# Patient Record
Sex: Female | Born: 1975 | Race: White | Hispanic: No | Marital: Married | Smoking: Never smoker
Health system: Southern US, Community
[De-identification: ages and names within clinical notes are randomized; demographics above are authoritative.]

## PROBLEM LIST (undated history)

## (undated) DIAGNOSIS — K219 Gastro-esophageal reflux disease without esophagitis: Secondary | ICD-10-CM

## (undated) DIAGNOSIS — F419 Anxiety disorder, unspecified: Secondary | ICD-10-CM

---

## 2007-01-18 ENCOUNTER — Inpatient Hospital Stay (HOSPITAL_COMMUNITY): Admission: AD | Admit: 2007-01-18 | Discharge: 2007-01-20 | Payer: Self-pay | Admitting: Obstetrics and Gynecology

## 2009-11-06 ENCOUNTER — Encounter: Admission: RE | Admit: 2009-11-06 | Discharge: 2009-11-06 | Payer: Self-pay | Admitting: Obstetrics and Gynecology

## 2010-05-21 ENCOUNTER — Inpatient Hospital Stay (HOSPITAL_COMMUNITY)
Admission: AD | Admit: 2010-05-21 | Discharge: 2010-05-21 | Disposition: A | Discharge status: Home / Self Care | Payer: BC Managed Care – PPO | Source: Ambulatory Visit | Attending: Obstetrics and Gynecology | Admitting: Obstetrics and Gynecology

## 2010-05-21 DIAGNOSIS — O2390 Unspecified genitourinary tract infection in pregnancy, unspecified trimester: Secondary | ICD-10-CM | POA: Insufficient documentation

## 2010-05-21 DIAGNOSIS — N390 Urinary tract infection, site not specified: Secondary | ICD-10-CM | POA: Insufficient documentation

## 2010-05-21 LAB — URINALYSIS, ROUTINE W REFLEX MICROSCOPIC
Bilirubin Urine: NEGATIVE
Hgb urine dipstick: NEGATIVE
Ketones, ur: NEGATIVE mg/dL

## 2010-05-21 LAB — URINE MICROSCOPIC-ADD ON

## 2010-05-22 ENCOUNTER — Inpatient Hospital Stay (HOSPITAL_COMMUNITY)
Admission: AD | Admit: 2010-05-22 | Discharge: 2010-05-22 | Disposition: A | Discharge status: Home / Self Care | Payer: BC Managed Care – PPO | Source: Ambulatory Visit | Attending: Obstetrics and Gynecology | Admitting: Obstetrics and Gynecology

## 2010-05-22 DIAGNOSIS — O4700 False labor before 37 completed weeks of gestation, unspecified trimester: Secondary | ICD-10-CM | POA: Insufficient documentation

## 2010-08-05 ENCOUNTER — Inpatient Hospital Stay (HOSPITAL_COMMUNITY)
Admission: AD | Admit: 2010-08-05 | Discharge: 2010-08-05 | Disposition: A | Discharge status: Home / Self Care | Payer: BC Managed Care – PPO | Source: Ambulatory Visit | Attending: Obstetrics and Gynecology | Admitting: Obstetrics and Gynecology

## 2010-08-05 DIAGNOSIS — O479 False labor, unspecified: Secondary | ICD-10-CM | POA: Insufficient documentation

## 2010-08-07 ENCOUNTER — Inpatient Hospital Stay (HOSPITAL_COMMUNITY)
Admission: AD | Admit: 2010-08-07 | Discharge: 2010-08-09 | DRG: 373 | Disposition: A | Discharge status: Home / Self Care | Payer: BC Managed Care – PPO | Source: Ambulatory Visit | Attending: Obstetrics and Gynecology | Admitting: Obstetrics and Gynecology

## 2010-08-07 LAB — CBC
Hemoglobin: 12.6 g/dL (ref 12.0–15.0)
MCV: 91 fL (ref 78.0–100.0)
Platelets: 191 10*3/uL (ref 150–400)
RDW: 13.7 % (ref 11.5–15.5)
WBC: 9.7 10*3/uL (ref 4.0–10.5)

## 2010-08-08 LAB — CBC
Hemoglobin: 10 g/dL — ABNORMAL LOW (ref 12.0–15.0)
MCH: 29.4 pg (ref 26.0–34.0)
MCHC: 32.4 g/dL (ref 30.0–36.0)
RBC: 3.4 MIL/uL — ABNORMAL LOW (ref 3.87–5.11)
WBC: 10.7 10*3/uL — ABNORMAL HIGH (ref 4.0–10.5)

## 2010-08-15 NOTE — Discharge Summary (Signed)
NAMEVIDALIA, SERPAS                  ACCOUNT NO.:  [id number]   MEDICAL RECORD NO.:  [id number]          PATIENT TYPE:  INP   LOCATION:  9108                          FACILITY:  [facility]   PHYSICIAN:  Gerrit Friends. Aldona Bar, M.D.   DATE OF BIRTH:  03-29-1976   DATE OF ADMISSION:  01/18/2007  DATE OF DISCHARGE:  01/20/2007                               DISCHARGE SUMMARY   DISCHARGE DIAGNOSES:  1. Term pregnancy, delivered of a 6 pound 15 ounces female infant,      Apgars 8 and 9.  2. Blood type A positive.   PROCEDURE:  1. Normal spontaneous delivery.  2. Second-degree tear and repair.   HISTORY OF PRESENT ILLNESS:  This 35 year old gravida 2, para 0 was  admitted for induction secondary to advanced cervical dilatation.  At  the time of admission, she was 4 cm dilated and she progressed on  Pitocin.  She subsequently underwent amniotomy with production of clear  fluid and subsequently had a normal spontaneous delivery of a 6 pound 15  ounces female infant with Apgars of 8 and 9 over a second-degree tear  which was repaired without difficulty.   Her postpartum course was benign.  Her discharge hemoglobin was 10.6  with a white count of 12,000 and a platelet count of 170,000.  On the  morning of January 20, 2007, she was ambulating well, tolerating a  regular diet well, having normal bowel and bladder function.  She was  breast feeding without difficulty and was desirous of discharge.  Accordingly, she was given all appropriate instructions per discharge  brochure and understood all instructions well.   DISCHARGE MEDICATIONS:  1. Vitamins 1 a day.  2. Feosol capsules 1 every other day.  3. The patient was given a prescription for Motrin 600 mg to use every      6 hours as needed for pain or cramps.   FOLLOW UP:  She will return to the office in followup in approximately 4  weeks' time or as needed.   CONDITION ON DISCHARGE:  Improved.      Gerrit Friends. Aldona Bar, M.D.  Electronically  Signed     RMW/MEDQ  D:  01/20/2007  T:  01/20/2007  Job:  [id number]

## 2011-01-07 LAB — CBC
HCT: 30.4 — ABNORMAL LOW
Hemoglobin: 10.6 — ABNORMAL LOW
MCHC: 34.7
Platelets: 170
RBC: 4.26
RDW: 13.8
WBC: 12 — ABNORMAL HIGH
WBC: 9.8

## 2014-03-30 HISTORY — PX: AUGMENTATION MAMMAPLASTY: SUR837

## 2018-04-11 DIAGNOSIS — N870 Mild cervical dysplasia: Secondary | ICD-10-CM | POA: Insufficient documentation

## 2019-06-09 ENCOUNTER — Other Ambulatory Visit: Payer: Self-pay | Admitting: Obstetrics & Gynecology

## 2019-06-09 DIAGNOSIS — N63 Unspecified lump in unspecified breast: Secondary | ICD-10-CM

## 2019-06-20 ENCOUNTER — Other Ambulatory Visit: Payer: Self-pay | Admitting: Obstetrics & Gynecology

## 2019-06-20 DIAGNOSIS — N63 Unspecified lump in unspecified breast: Secondary | ICD-10-CM

## 2019-06-28 ENCOUNTER — Other Ambulatory Visit: Payer: Self-pay

## 2019-06-28 ENCOUNTER — Ambulatory Visit
Admission: RE | Admit: 2019-06-28 | Discharge: 2019-06-28 | Disposition: A | Discharge status: Home / Self Care | Payer: No Typology Code available for payment source | Source: Ambulatory Visit | Attending: Obstetrics & Gynecology | Admitting: Obstetrics & Gynecology

## 2019-06-28 DIAGNOSIS — N63 Unspecified lump in unspecified breast: Secondary | ICD-10-CM | POA: Insufficient documentation

## 2020-03-20 ENCOUNTER — Other Ambulatory Visit: Payer: Self-pay | Admitting: Obstetrics and Gynecology

## 2020-04-23 ENCOUNTER — Other Ambulatory Visit: Payer: Self-pay | Admitting: Obstetrics and Gynecology

## 2020-06-07 ENCOUNTER — Other Ambulatory Visit (HOSPITAL_COMMUNITY): Payer: Self-pay | Admitting: Pharmacist

## 2020-08-29 ENCOUNTER — Other Ambulatory Visit: Payer: Self-pay

## 2020-08-29 MED ORDER — CARESTART COVID-19 HOME TEST VI KIT
PACK | 0 refills | Status: DC
  Filled 2020-08-29: qty 2, 4d supply, fill #0

## 2020-09-08 MED FILL — Fluoxetine HCl Cap 20 MG: ORAL | 90 days supply | Qty: 90 | Fill #0 | Status: AC

## 2020-09-09 ENCOUNTER — Other Ambulatory Visit: Payer: Self-pay

## 2020-12-19 MED FILL — Fluoxetine HCl Cap 20 MG: ORAL | 90 days supply | Qty: 90 | Fill #1 | Status: CN

## 2020-12-20 ENCOUNTER — Other Ambulatory Visit: Payer: Self-pay

## 2020-12-20 MED FILL — Fluoxetine HCl Cap 20 MG: ORAL | 90 days supply | Qty: 90 | Fill #1 | Status: CN

## 2020-12-20 MED FILL — Fluoxetine HCl Cap 20 MG: ORAL | 90 days supply | Qty: 90 | Fill #1 | Status: AC

## 2021-01-24 ENCOUNTER — Ambulatory Visit
Admission: EM | Admit: 2021-01-24 | Discharge: 2021-01-24 | Disposition: A | Discharge status: Home / Self Care | Payer: No Typology Code available for payment source | Attending: Emergency Medicine | Admitting: Emergency Medicine

## 2021-01-24 ENCOUNTER — Encounter: Payer: Self-pay | Admitting: Emergency Medicine

## 2021-01-24 ENCOUNTER — Other Ambulatory Visit: Payer: Self-pay

## 2021-01-24 DIAGNOSIS — L02416 Cutaneous abscess of left lower limb: Secondary | ICD-10-CM | POA: Diagnosis not present

## 2021-01-24 MED ORDER — DOXYCYCLINE HYCLATE 100 MG PO CAPS
100.0000 mg | ORAL_CAPSULE | Freq: Two times a day (BID) | ORAL | 0 refills | Status: DC
  Filled 2021-01-24: qty 20, 10d supply, fill #0

## 2021-01-24 NOTE — ED Triage Notes (Signed)
Pt here with a painful abscess to left upper posterior thigh x 3 days. Not actively draining.

## 2021-01-24 NOTE — ED Provider Notes (Signed)
Chief Complaint   Chief Complaint  Patient presents with   Abscess     Subjective, HPI  Natalie Levine is a very pleasant 45 y.o. female who presents with abscess to left upper posterior thigh region which started about 3 days ago.  Patient reports pain to the area.  No active draining.  Patient does report using sterile tools to try to open the area which did not give her any return.  Patient does not report any fever, chills, vomiting or additional symptoms today.  History obtained from patient.   Patient's problem list, past medical and social history, medications, and allergies were reviewed by me and updated in Epic.    ROS  See HPI.  Objective   Vitals:   01/24/21 1613  BP: 135/76  Pulse: 71  Resp: 20  Temp: 98.1 F (36.7 C)  SpO2: 98%    Vital signs and nursing note reviewed.  General: Appears well-developed and well-nourished. No acute distress.  HEENT: Normocephalic, atraumatic, hearing grossly intact. EOMI, no drainage. No rhinorrhea. Moist mucous membranes.  Neck: Normal range of motion, neck is supple.  Cardiovascular: Normal rate.  Pulm/Chest: No respiratory distress.   Musculoskeletal: No joint deformity, normal range of motion.  Skin: Abscess noted to left posterior thigh measuring approximately 1 cm x 1 point centimeter.  Area is indurated without fluctuance.  No active draining.  There is surrounding erythema of about 4 cm.  Data  No results found for any visits on 01/24/21.    Assessment & Plan  1. Abscess of left thigh - doxycycline (VIBRAMYCIN) 100 MG capsule; Take 1 capsule (100 mg total) by mouth 2 (two) times daily.  Dispense: 20 capsule; Refill: 0  45 y.o. female presents with abscess to left upper posterior thigh region which started about 3 days ago.  Patient reports pain to the area.  No active draining.  Patient does report using sterile tools to try to open the area which did not give her any return.  Patient does not report any fever,  chills, vomiting or additional symptoms today.  Likely, abscess.  Area is indurated without any fluctuance, no concern that the area needs to be incised and drained today.  Rx doxycycline to the patient's preferred pharmacy and advised the use of warm compresses.  Advised that the area should gradually improve over the next 7 to 10 days.  Advised to return with any worsening signs of infection to include redness, swelling, streaking, fever or worsening pain.  Patient verbalized understanding and agreed with plan.  Patient stable upon discharge.  Return as needed.  Plan:   Discharge Instructions      Take antibiotic (Doxycycline) with food as prescribed. Apply warm compresses to promote drainage. Keep covered until wound closes. Expect it to gradually improve over 7 - 10 days.  Watch for signs of worsening infection: increased redness, swelling, red streaking, fever, or worsening pain. If these symptoms are present, or if you have new or concerning symptoms, return to clinic immediately for a recheck.          Amalia Greenhouse, FNP 01/24/21 1620

## 2021-01-24 NOTE — Discharge Instructions (Addendum)
Take antibiotic (Doxycycline) with food as prescribed. Apply warm compresses to promote drainage. Expect it to gradually improve over 7 - 10 days.  Watch for signs of worsening infection: increased redness, swelling, red streaking, fever, or worsening pain. If these symptoms are present, or if you have new or concerning symptoms, return to clinic immediately for a recheck.  

## 2021-03-13 ENCOUNTER — Other Ambulatory Visit: Payer: Self-pay

## 2021-03-13 MED FILL — Fluoxetine HCl Cap 20 MG: ORAL | 90 days supply | Qty: 90 | Fill #2 | Status: AC

## 2021-04-03 DIAGNOSIS — F32A Depression, unspecified: Secondary | ICD-10-CM | POA: Insufficient documentation

## 2021-06-12 ENCOUNTER — Other Ambulatory Visit: Payer: Self-pay

## 2021-06-13 ENCOUNTER — Other Ambulatory Visit: Payer: Self-pay

## 2021-06-13 MED ORDER — FLUOXETINE HCL 20 MG PO CAPS
ORAL_CAPSULE | ORAL | 4 refills | Status: DC
  Filled 2021-06-13: qty 30, 30d supply, fill #0
  Filled 2021-06-13: qty 90, 90d supply, fill #0

## 2021-07-17 ENCOUNTER — Other Ambulatory Visit: Payer: Self-pay

## 2021-07-21 ENCOUNTER — Other Ambulatory Visit: Payer: Self-pay

## 2021-08-12 ENCOUNTER — Other Ambulatory Visit: Payer: Self-pay

## 2021-11-06 ENCOUNTER — Other Ambulatory Visit: Payer: Self-pay

## 2022-01-15 IMAGING — US US BREAST*R* LIMITED INC AXILLA
1 series · 5 of 5 positions shown · non-contrast
Comparison: Previous exam(s).

CLINICAL DATA: 43-year-old presenting with a possible palpable lump
associated with focal tenderness in the UPPER OUTER QUADRANT of the
LEFT breast which she noted after her second [id number] vaccine. She
also has possible palpable lumps in the LOWER INNER periareolar LEFT
breast and the UPPER OUTER QUADRANT of the RIGHT breast on recent
clinical examination. The patient states that she does not palpate a
lump in the RIGHT breast.

EXAM:
DIGITAL DIAGNOSTIC BILATERAL MAMMOGRAM WITH IMPLANTS, CAD AND TOMO
LIMITED ULTRASOUND BILATERAL BREASTS

[Series 1: us breast*right* limited inc axilla · 0.05mm/px · 5 of 5 slices shown]
[im 1/5]
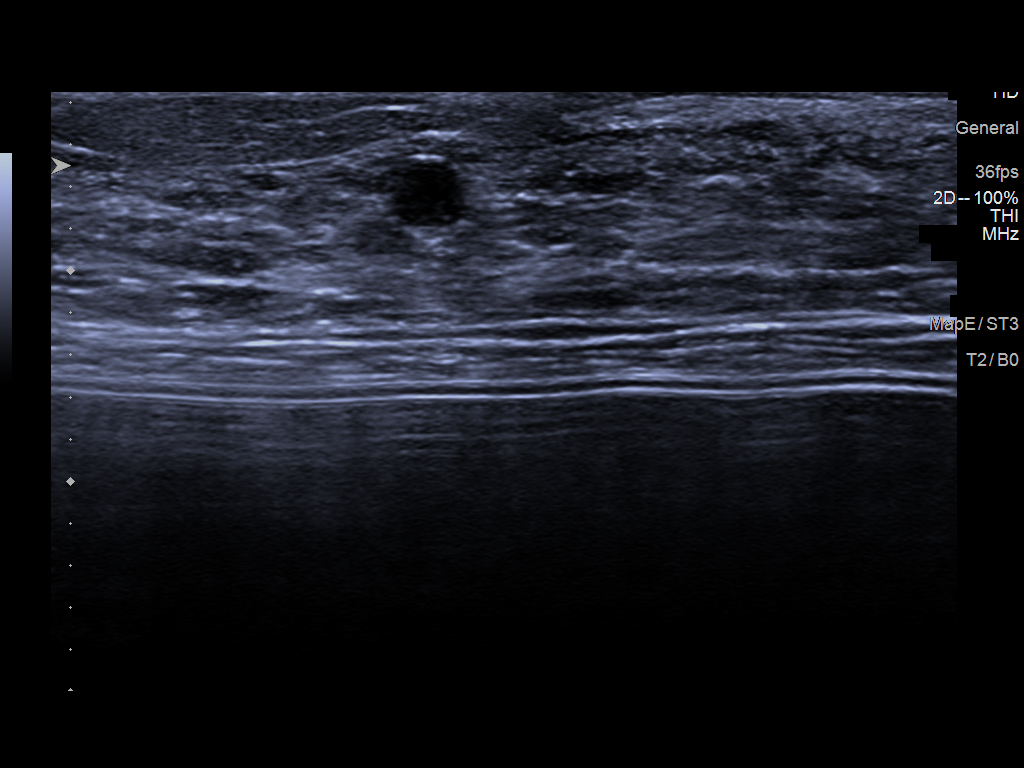
[im 2/5]
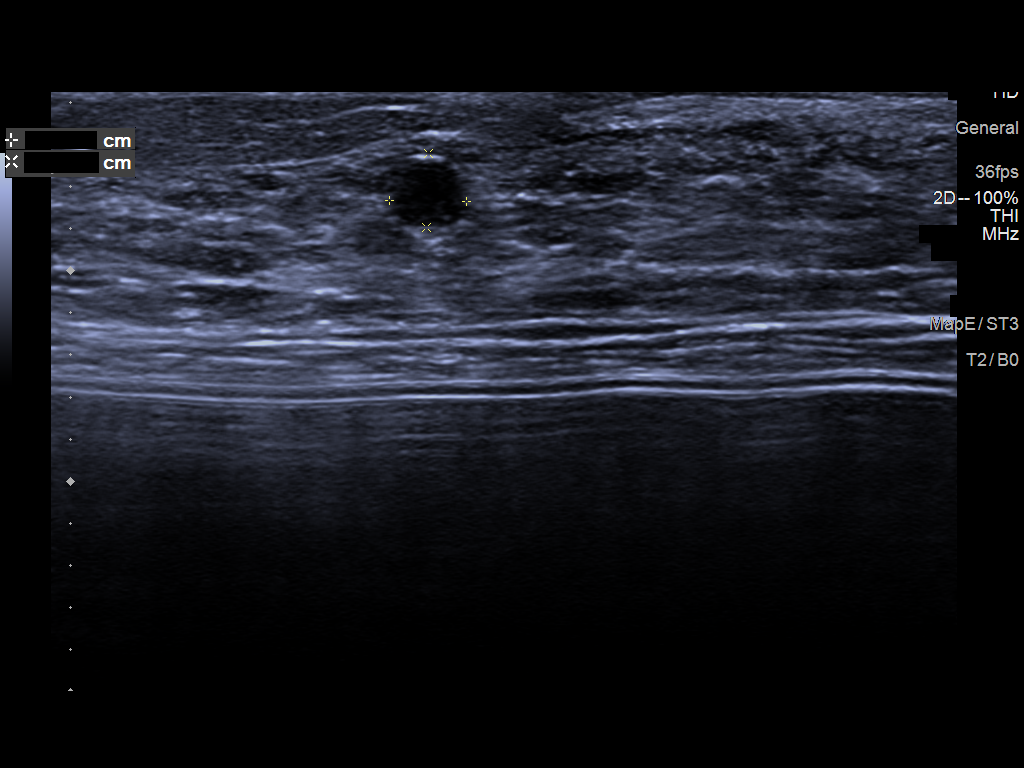
[im 3/5]
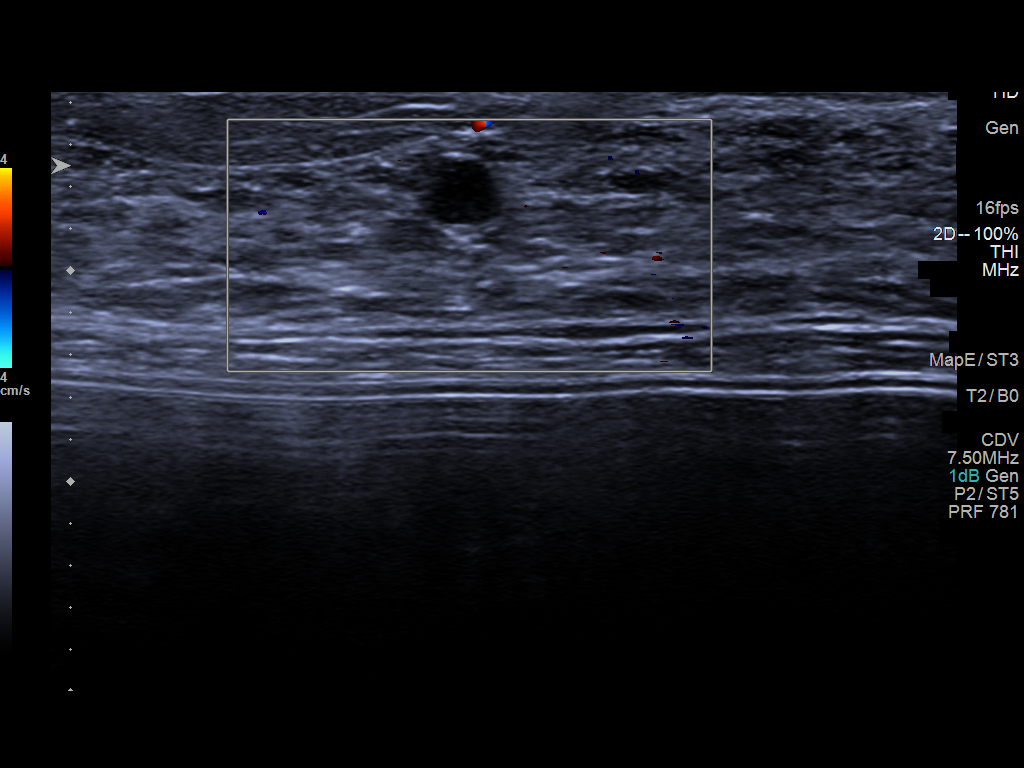
[im 4/5]
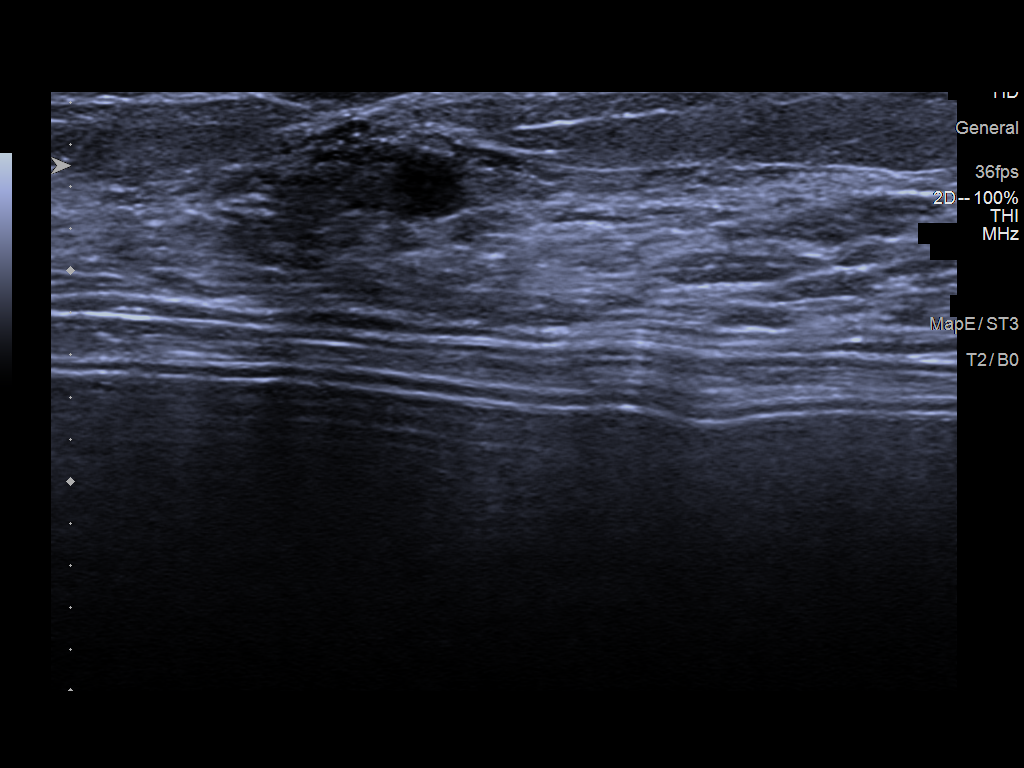
[im 5/5]
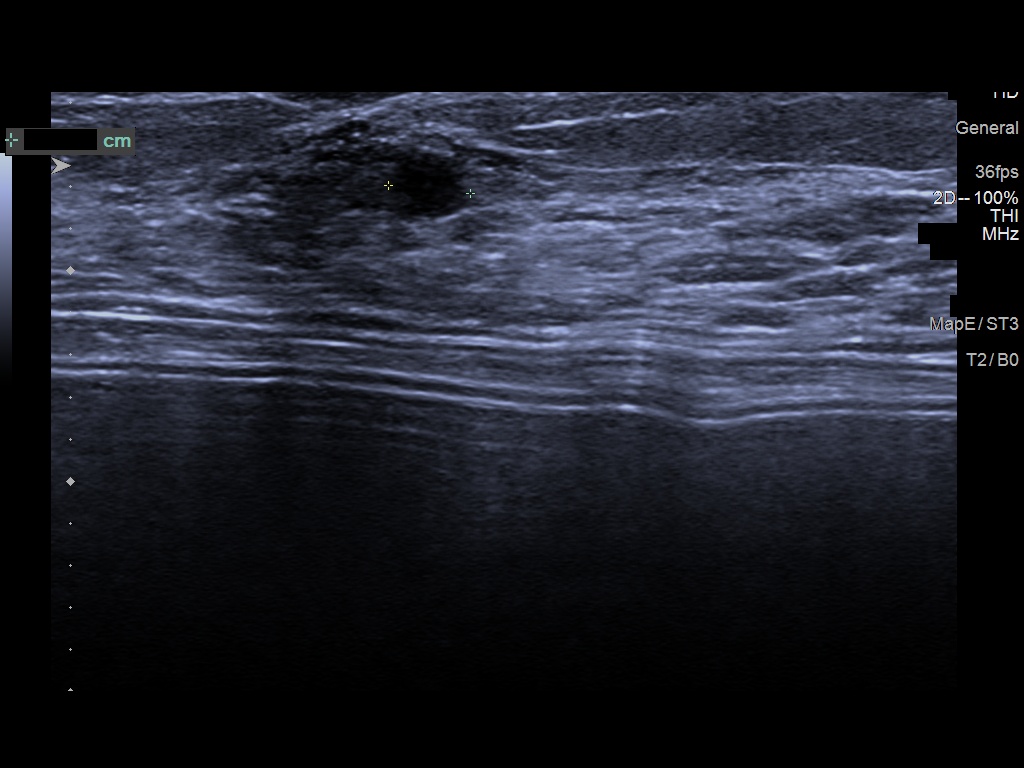

[5 of 5 positions shown; findings below may reference images not displayed]

ACR Breast Density Category d: The breast tissue is extremely dense,
which lowers the sensitivity of mammography.
FINDINGS: The patient has retropectoral saline implants. Standard 2D full
field CC and MLO views of both breasts and tomosynthesis and
synthesized implant displaced CC and MLO views of both breasts were
obtained. Tomosynthesis and synthesized spot compression tangential
view of the areas of concern in the LEFT breast were also obtained.

Spot tangential images demonstrate no mammographic abnormalities in
the area of palpable concern in UPPER OUTER LEFT breast and the
LOWER INNER LEFT breast. Dense fibroglandular tissue is present in
both of these locations.

Dense fibroglandular tissue is present in the UPPER OUTER QUADRANT
of the RIGHT breast in the area of possible palpable concern.

No findings suspicious for malignancy in either breast.

Mammographic images were processed with CAD.

On correlative physical exam, there is a faint palpable ridge of
tissue the UPPER OUTER quadrant of the LEFT breast in the area of
patient concern, though I do not palpate a discrete mass.

Targeted LEFT breast ultrasound is performed, showing normal dense
fibroglandular tissue in the UPPER OUTER QUADRANT at the blank
o'clock position approximately blank cm from nipple in the area of
palpable concern. No cyst, solid mass or abnormal acoustic shadowing
is identified.

Normal dense fibroglandular tissue is also present in the LOWER
INNER QUADRANT at the 7 o'clock position approximately 4 cm from
nipple in the area of possible palpable concern. The fibroglandular
tissue extends very close to the skin surface at this location, and
there is a prominent fat lobule in this location as well. No cyst,
solid mass or abnormal acoustic shadowing is identified.

Targeted RIGHT breast ultrasound is performed in the area of
palpable concern in the UPPER OUTER QUADRANT, showing a benign
simple cyst at the 11 o'clock position approximately 4 cm from the
nipple measuring approximately 4 mm diameter, demonstrating
posterior acoustic enhancement and no internal power Doppler flow.
No suspicious solid mass or abnormal acoustic shadowing is
identified.
IMPRESSION: 1. No mammographic or sonographic evidence of malignancy involving
either breast.
2. Benign 4 mm simple cyst in the UPPER OUTER QUADRANT of the RIGHT
breast.

RECOMMENDATION:
Screening mammogram in one year.(Code:[id number])

The patient and I discussed whether are not supplemental screening
MRI would be necessary, given her family history of breast cancer in
her paternal grandmother and her dense breast tissue
mammographically. I counseled her that if her lifetime risk of
breast cancer was estimated at 20% or greater on a breast cancer
risk model (the Tyrer Cuzick model being the leading model used
currently), then supplemental screening MRI would be recommended. If
supplemental screening MRI is indicated, then it is recommended that
the MRI be performed at six-month intervals from the screening
mammogram.

I have discussed the findings and recommendations with the patient.
If applicable, a reminder letter will be sent to the patient
regarding the next appointment.

BI-RADS CATEGORY  2: Benign.

## 2022-01-15 IMAGING — US US BREAST*L* LIMITED INC AXILLA
1 series · 7 of 7 positions shown · non-contrast
Comparison: Previous exam(s).

CLINICAL DATA: 43-year-old presenting with a possible palpable lump
associated with focal tenderness in the UPPER OUTER QUADRANT of the
LEFT breast which she noted after her second [id number] vaccine. She
also has possible palpable lumps in the LOWER INNER periareolar LEFT
breast and the UPPER OUTER QUADRANT of the RIGHT breast on recent
clinical examination. The patient states that she does not palpate a
lump in the RIGHT breast.

EXAM:
DIGITAL DIAGNOSTIC BILATERAL MAMMOGRAM WITH IMPLANTS, CAD AND TOMO
LIMITED ULTRASOUND BILATERAL BREASTS

[Series 1: us breast*left* limited inc axilla · 0.04mm/px · 7 of 7 slices shown]
[im 1/7]
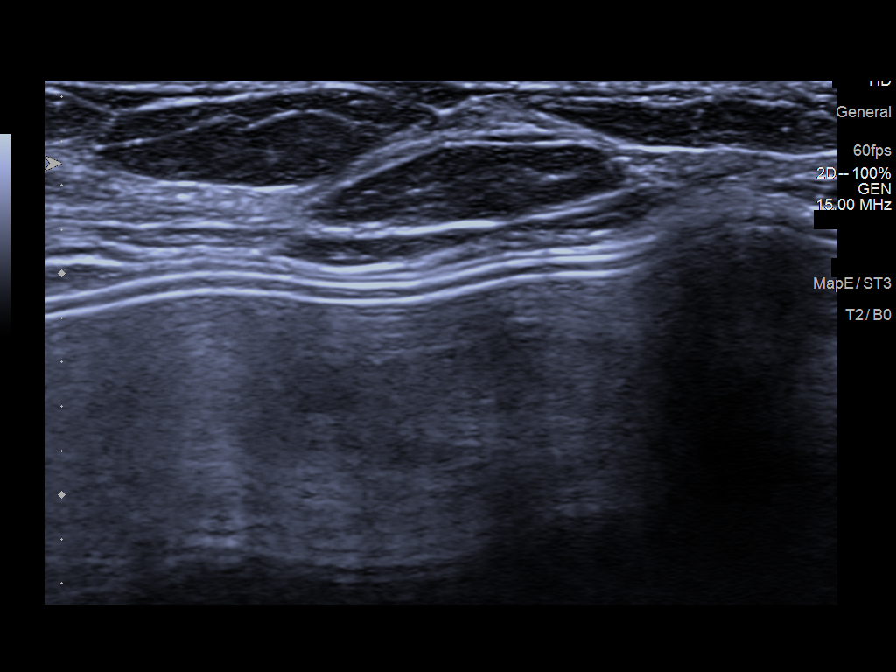
[im 2/7]
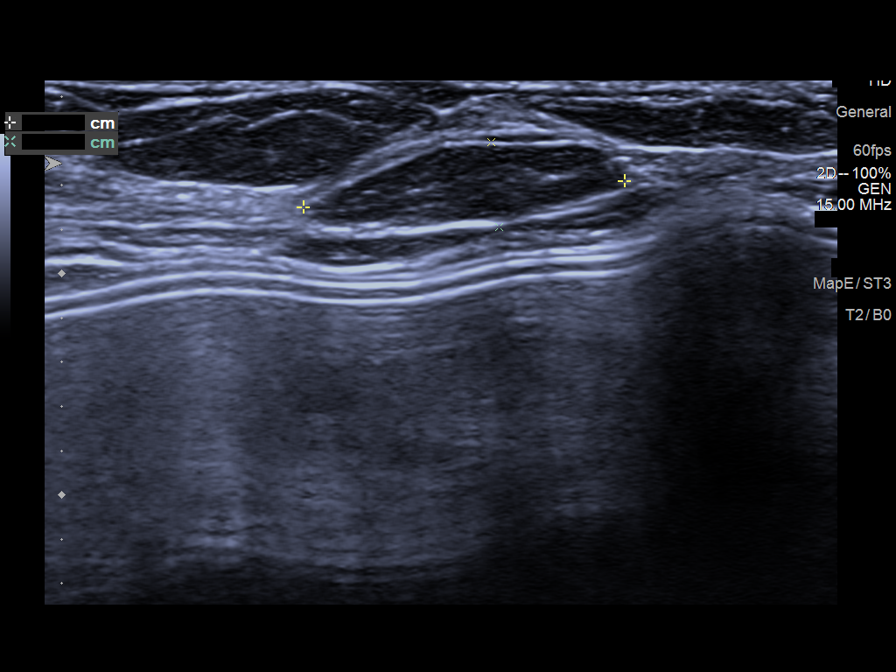
[im 3/7]
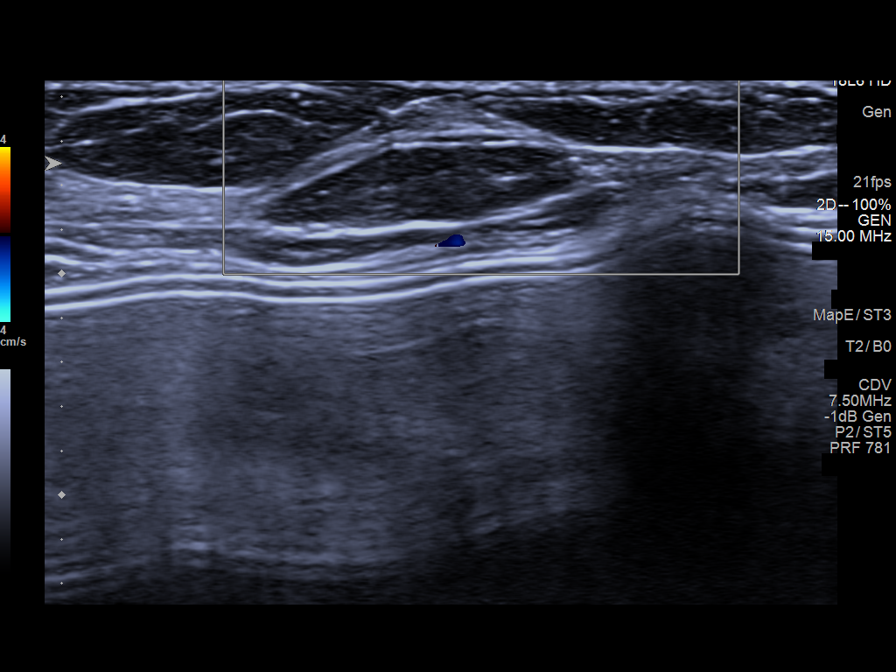
[im 4/7]
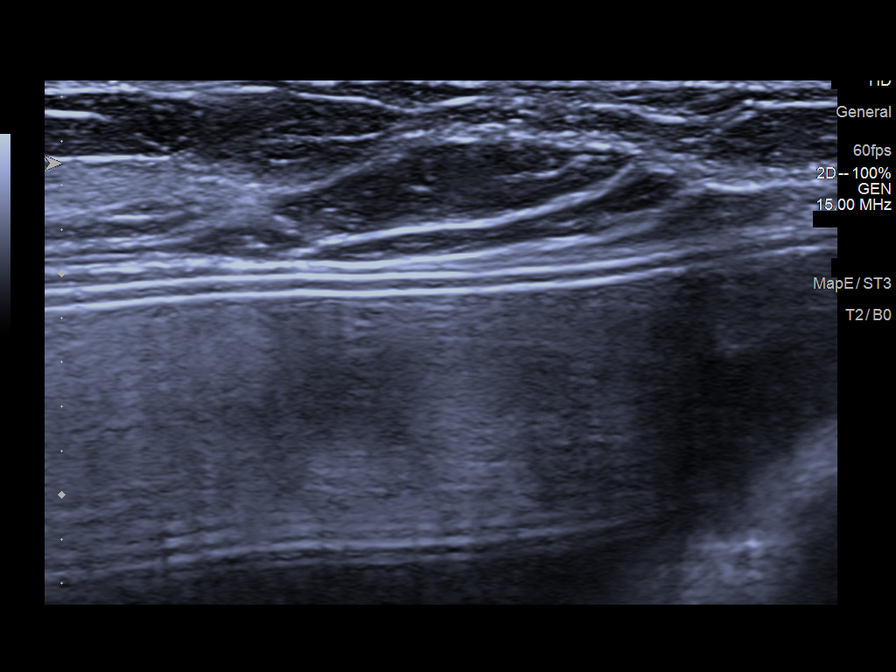
[im 5/7]
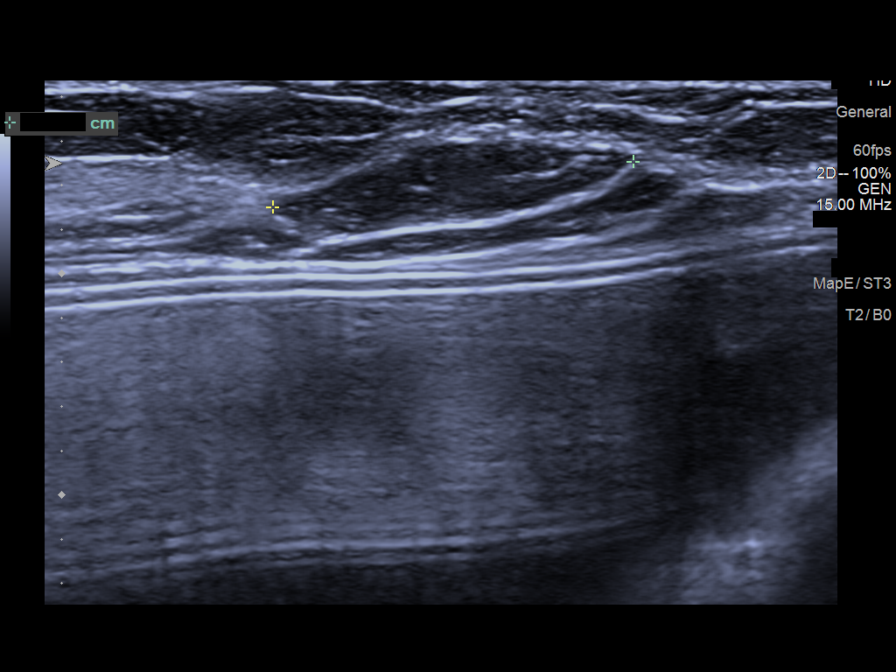
[im 6/7]
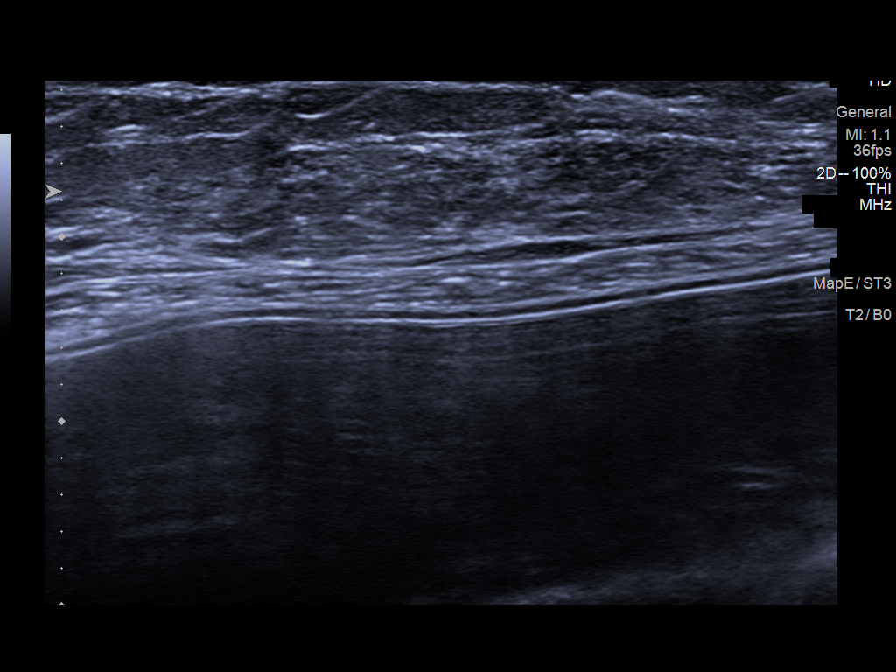
[im 7/7]
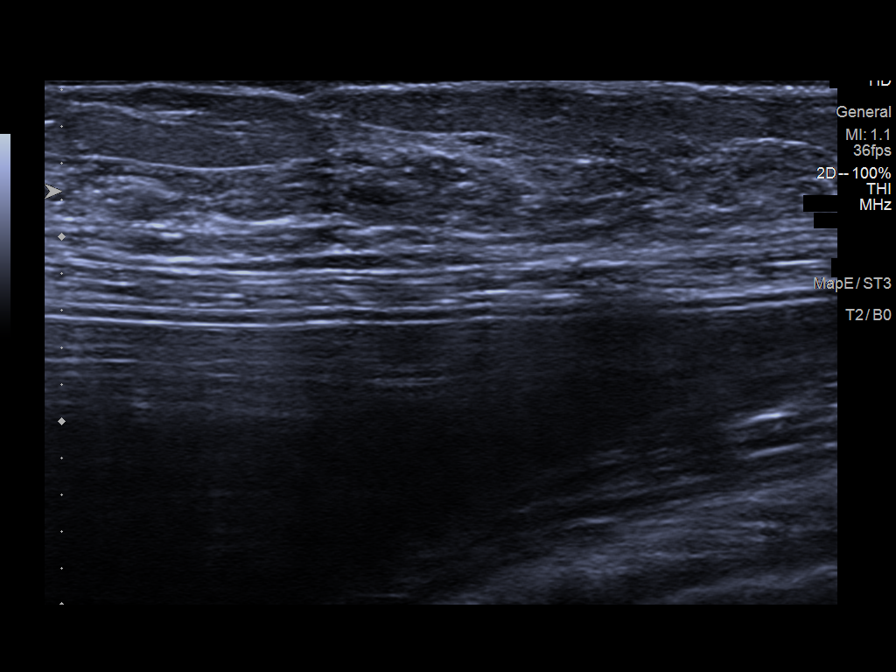

[7 of 7 positions shown; findings below may reference images not displayed]

ACR Breast Density Category d: The breast tissue is extremely dense,
which lowers the sensitivity of mammography.
FINDINGS: The patient has retropectoral saline implants. Standard 2D full
field CC and MLO views of both breasts and tomosynthesis and
synthesized implant displaced CC and MLO views of both breasts were
obtained. Tomosynthesis and synthesized spot compression tangential
view of the areas of concern in the LEFT breast were also obtained.

Spot tangential images demonstrate no mammographic abnormalities in
the area of palpable concern in UPPER OUTER LEFT breast and the
LOWER INNER LEFT breast. Dense fibroglandular tissue is present in
both of these locations.

Dense fibroglandular tissue is present in the UPPER OUTER QUADRANT
of the RIGHT breast in the area of possible palpable concern.

No findings suspicious for malignancy in either breast.

Mammographic images were processed with CAD.

On correlative physical exam, there is a faint palpable ridge of
tissue the UPPER OUTER quadrant of the LEFT breast in the area of
patient concern, though I do not palpate a discrete mass.

Targeted LEFT breast ultrasound is performed, showing normal dense
fibroglandular tissue in the UPPER OUTER QUADRANT at the blank
o'clock position approximately blank cm from nipple in the area of
palpable concern. No cyst, solid mass or abnormal acoustic shadowing
is identified.

Normal dense fibroglandular tissue is also present in the LOWER
INNER QUADRANT at the 7 o'clock position approximately 4 cm from
nipple in the area of possible palpable concern. The fibroglandular
tissue extends very close to the skin surface at this location, and
there is a prominent fat lobule in this location as well. No cyst,
solid mass or abnormal acoustic shadowing is identified.

Targeted RIGHT breast ultrasound is performed in the area of
palpable concern in the UPPER OUTER QUADRANT, showing a benign
simple cyst at the 11 o'clock position approximately 4 cm from the
nipple measuring approximately 4 mm diameter, demonstrating
posterior acoustic enhancement and no internal power Doppler flow.
No suspicious solid mass or abnormal acoustic shadowing is
identified.
IMPRESSION: 1. No mammographic or sonographic evidence of malignancy involving
either breast.
2. Benign 4 mm simple cyst in the UPPER OUTER QUADRANT of the RIGHT
breast.

RECOMMENDATION:
Screening mammogram in one year.(Code:[id number])

The patient and I discussed whether are not supplemental screening
MRI would be necessary, given her family history of breast cancer in
her paternal grandmother and her dense breast tissue
mammographically. I counseled her that if her lifetime risk of
breast cancer was estimated at 20% or greater on a breast cancer
risk model (the Tyrer Cuzick model being the leading model used
currently), then supplemental screening MRI would be recommended. If
supplemental screening MRI is indicated, then it is recommended that
the MRI be performed at six-month intervals from the screening
mammogram.

I have discussed the findings and recommendations with the patient.
If applicable, a reminder letter will be sent to the patient
regarding the next appointment.

BI-RADS CATEGORY  2: Benign.

## 2022-01-15 IMAGING — MG MM  DIGITAL DIAGNOSTIC BREAST BILAT IMPLANT W/ TOMO W/ CAD
7 of 16 series · 7 of 40 positions shown · non-contrast
Comparison: Previous exam(s).

CLINICAL DATA: 43-year-old presenting with a possible palpable lump
associated with focal tenderness in the UPPER OUTER QUADRANT of the
LEFT breast which she noted after her second [id number] vaccine. She
also has possible palpable lumps in the LOWER INNER periareolar LEFT
breast and the UPPER OUTER QUADRANT of the RIGHT breast on recent
clinical examination. The patient states that she does not palpate a
lump in the RIGHT breast.

EXAM:
DIGITAL DIAGNOSTIC BILATERAL MAMMOGRAM WITH IMPLANTS, CAD AND TOMO
LIMITED ULTRASOUND BILATERAL BREASTS

[L MLO]
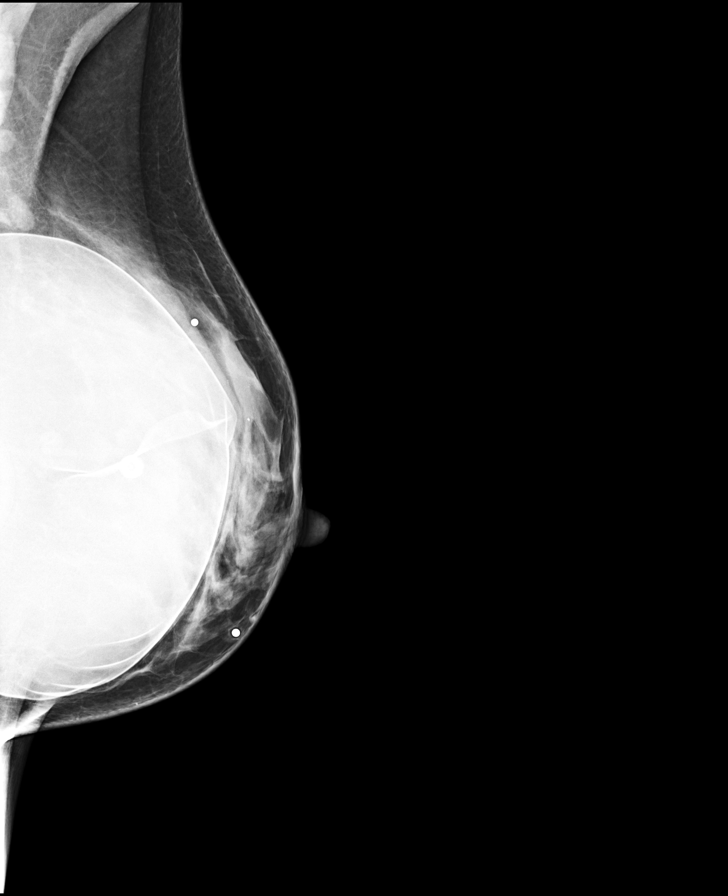

[L CC]
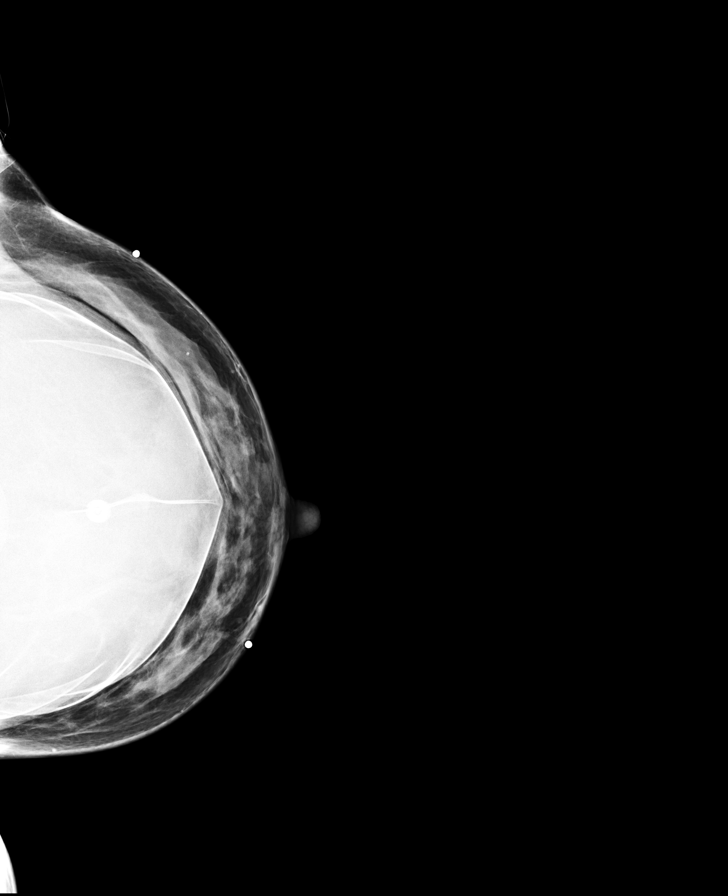

[R CC]
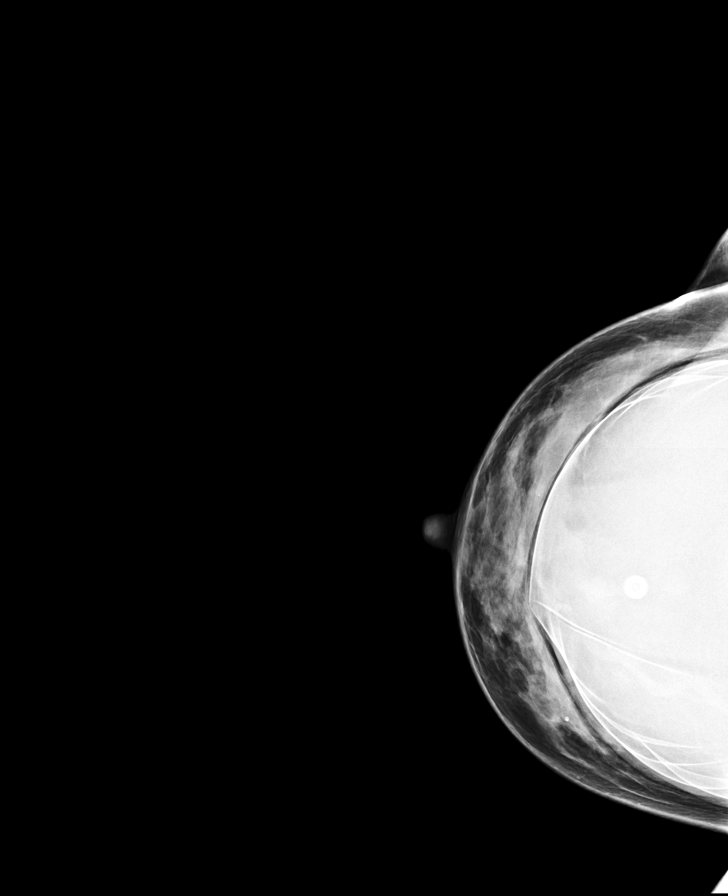

[R MLO]
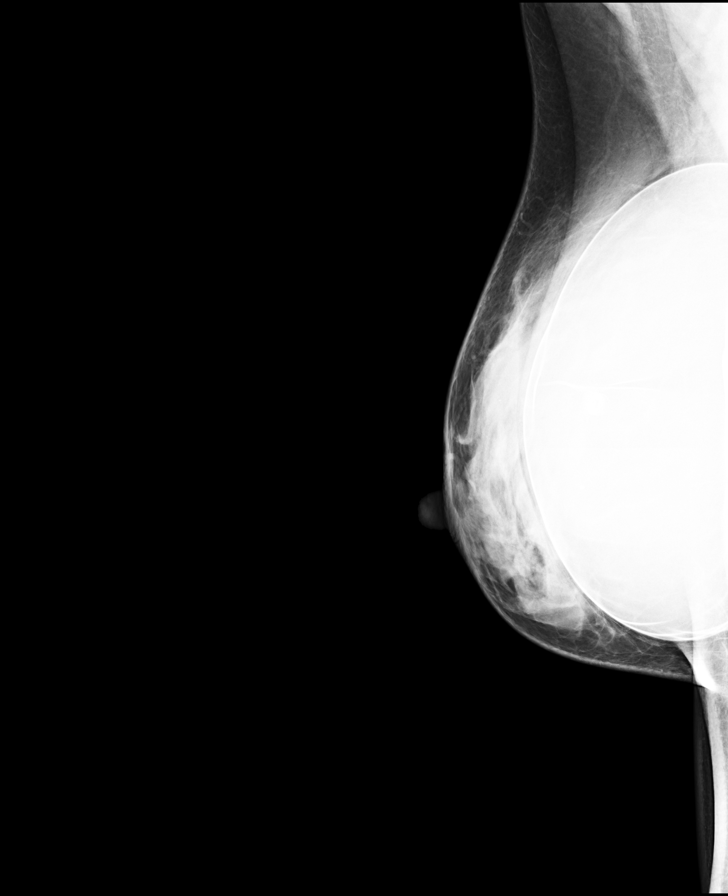

[L CC synth-2D (1 of 2)]
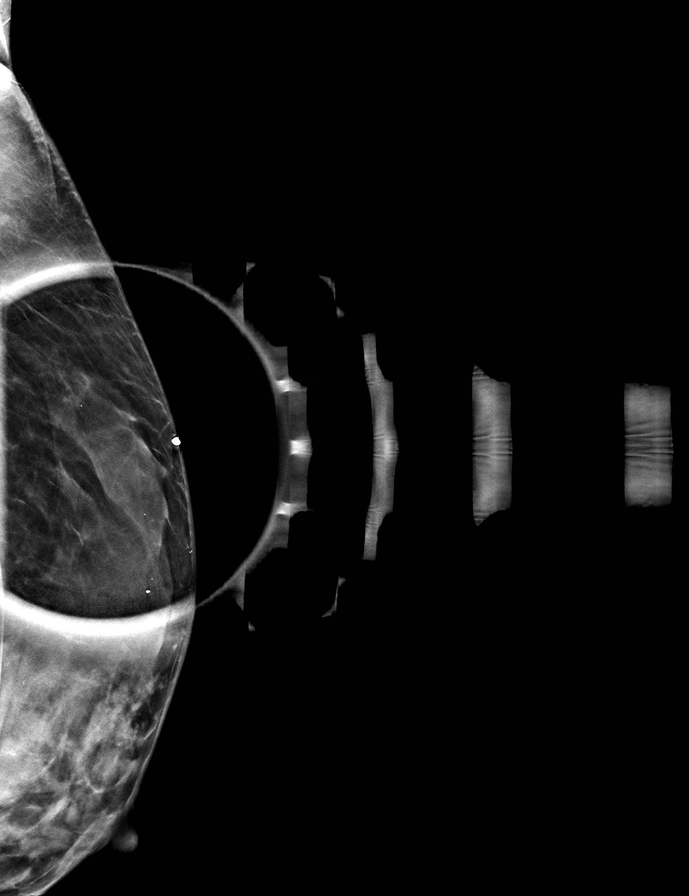

[L CC synth-2D (2 of 2)]
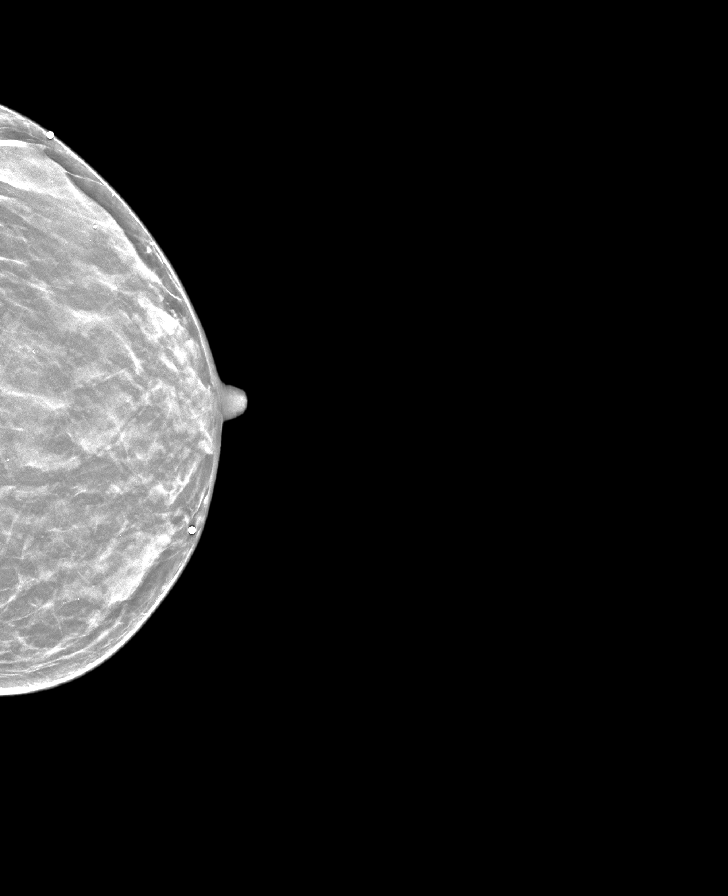

[L MLO synth-2D]
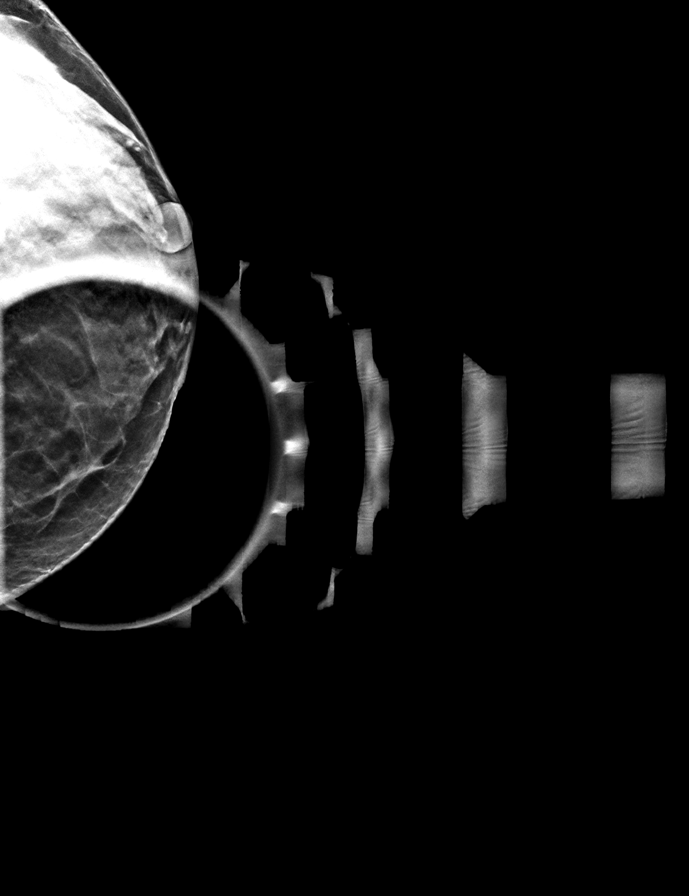

[7 of 40 positions shown; findings below may reference images not displayed]

ACR Breast Density Category d: The breast tissue is extremely dense,
which lowers the sensitivity of mammography.
FINDINGS: The patient has retropectoral saline implants. Standard 2D full
field CC and MLO views of both breasts and tomosynthesis and
synthesized implant displaced CC and MLO views of both breasts were
obtained. Tomosynthesis and synthesized spot compression tangential
view of the areas of concern in the LEFT breast were also obtained.

Spot tangential images demonstrate no mammographic abnormalities in
the area of palpable concern in UPPER OUTER LEFT breast and the
LOWER INNER LEFT breast. Dense fibroglandular tissue is present in
both of these locations.

Dense fibroglandular tissue is present in the UPPER OUTER QUADRANT
of the RIGHT breast in the area of possible palpable concern.

No findings suspicious for malignancy in either breast.

Mammographic images were processed with CAD.

On correlative physical exam, there is a faint palpable ridge of
tissue the UPPER OUTER quadrant of the LEFT breast in the area of
patient concern, though I do not palpate a discrete mass.

Targeted LEFT breast ultrasound is performed, showing normal dense
fibroglandular tissue in the UPPER OUTER QUADRANT at the blank
o'clock position approximately blank cm from nipple in the area of
palpable concern. No cyst, solid mass or abnormal acoustic shadowing
is identified.

Normal dense fibroglandular tissue is also present in the LOWER
INNER QUADRANT at the 7 o'clock position approximately 4 cm from
nipple in the area of possible palpable concern. The fibroglandular
tissue extends very close to the skin surface at this location, and
there is a prominent fat lobule in this location as well. No cyst,
solid mass or abnormal acoustic shadowing is identified.

Targeted RIGHT breast ultrasound is performed in the area of
palpable concern in the UPPER OUTER QUADRANT, showing a benign
simple cyst at the 11 o'clock position approximately 4 cm from the
nipple measuring approximately 4 mm diameter, demonstrating
posterior acoustic enhancement and no internal power Doppler flow.
No suspicious solid mass or abnormal acoustic shadowing is
identified.
IMPRESSION: 1. No mammographic or sonographic evidence of malignancy involving
either breast.
2. Benign 4 mm simple cyst in the UPPER OUTER QUADRANT of the RIGHT
breast.

RECOMMENDATION:
Screening mammogram in one year.(Code:[id number])

The patient and I discussed whether are not supplemental screening
MRI would be necessary, given her family history of breast cancer in
her paternal grandmother and her dense breast tissue
mammographically. I counseled her that if her lifetime risk of
breast cancer was estimated at 20% or greater on a breast cancer
risk model (the Tyrer Cuzick model being the leading model used
currently), then supplemental screening MRI would be recommended. If
supplemental screening MRI is indicated, then it is recommended that
the MRI be performed at six-month intervals from the screening
mammogram.

I have discussed the findings and recommendations with the patient.
If applicable, a reminder letter will be sent to the patient
regarding the next appointment.

BI-RADS CATEGORY  2: Benign.

## 2022-05-28 ENCOUNTER — Other Ambulatory Visit: Payer: Self-pay | Admitting: Student

## 2022-05-28 DIAGNOSIS — Z1231 Encounter for screening mammogram for malignant neoplasm of breast: Secondary | ICD-10-CM

## 2022-05-28 DIAGNOSIS — E782 Mixed hyperlipidemia: Secondary | ICD-10-CM | POA: Insufficient documentation

## 2022-06-09 ENCOUNTER — Encounter: Payer: Self-pay | Admitting: *Deleted

## 2022-07-21 ENCOUNTER — Ambulatory Visit
Admission: RE | Admit: 2022-07-21 | Discharge: 2022-07-21 | Disposition: A | Discharge status: Home / Self Care | Payer: Managed Care, Other (non HMO) | Source: Ambulatory Visit | Attending: Student | Admitting: Student

## 2022-07-21 DIAGNOSIS — Z1231 Encounter for screening mammogram for malignant neoplasm of breast: Secondary | ICD-10-CM

## 2023-03-03 ENCOUNTER — Other Ambulatory Visit: Payer: Self-pay

## 2023-03-03 DIAGNOSIS — Z1211 Encounter for screening for malignant neoplasm of colon: Secondary | ICD-10-CM

## 2023-03-03 DIAGNOSIS — K219 Gastro-esophageal reflux disease without esophagitis: Secondary | ICD-10-CM

## 2023-03-03 MED ORDER — NA SULFATE-K SULFATE-MG SULF 17.5-3.13-1.6 GM/177ML PO SOLN
1.0000 | Freq: Once | ORAL | 0 refills | Status: AC
Start: 2023-03-03 — End: 2023-03-03
  Filled 2023-03-03: qty 354, 1d supply, fill #0

## 2023-03-04 ENCOUNTER — Encounter: Payer: Self-pay | Admitting: Gastroenterology

## 2023-03-11 ENCOUNTER — Encounter: Payer: Self-pay | Admitting: Gastroenterology

## 2023-03-11 NOTE — Anesthesia Preprocedure Evaluation (Addendum)
Anesthesia Evaluation  Patient identified by MRN, date of birth, ID band Patient awake    Reviewed: Allergy & Precautions, H&P , NPO status , Patient's Chart, lab work & pertinent test results  Airway Mallampati: I  TM Distance: >3 FB Neck ROM: Full    Dental no notable dental hx.    Pulmonary neg pulmonary ROS   Pulmonary exam normal breath sounds clear to auscultation       Cardiovascular negative cardio ROS Normal cardiovascular exam Rhythm:Regular Rate:Normal     Neuro/Psych  PSYCHIATRIC DISORDERS Anxiety Depression    negative neurological ROS  negative psych ROS   GI/Hepatic negative GI ROS, Neg liver ROS,,,  Endo/Other  negative endocrine ROS    Renal/GU negative Renal ROS  negative genitourinary   Musculoskeletal negative musculoskeletal ROS (+)    Abdominal   Peds negative pediatric ROS (+)  Hematology negative hematology ROS (+)   Anesthesia Other Findings Anxiety and depression  Reproductive/Obstetrics negative OB ROS                              Anesthesia Physical Anesthesia Plan  ASA: 1  Anesthesia Plan: General   Post-op Pain Management:    Induction: Intravenous  PONV Risk Score and Plan:   Airway Management Planned: Natural Airway and Nasal Cannula  Additional Equipment:   Intra-op Plan:   Post-operative Plan:   Informed Consent: I have reviewed the patients History and Physical, chart, labs and discussed the procedure including the risks, benefits and alternatives for the proposed anesthesia with the patient or authorized representative who has indicated his/her understanding and acceptance.     Dental Advisory Given  Plan Discussed with: Anesthesiologist, CRNA and Surgeon  Anesthesia Plan Comments: (Patient consented for risks of anesthesia including but not limited to:  - adverse reactions to medications - risk of airway placement if required -  damage to eyes, teeth, lips or other oral mucosa - nerve damage due to positioning  - sore throat or hoarseness - Damage to heart, brain, nerves, lungs, other parts of body or loss of life  Patient voiced understanding and assent.)         Anesthesia Quick Evaluation

## 2023-03-15 ENCOUNTER — Other Ambulatory Visit: Payer: Self-pay

## 2023-03-18 ENCOUNTER — Other Ambulatory Visit: Payer: Self-pay

## 2023-03-19 ENCOUNTER — Other Ambulatory Visit: Payer: Self-pay

## 2023-03-19 ENCOUNTER — Ambulatory Visit: Payer: Managed Care, Other (non HMO) | Admitting: Anesthesiology

## 2023-03-19 ENCOUNTER — Encounter: Payer: Self-pay | Admitting: Gastroenterology

## 2023-03-19 ENCOUNTER — Encounter
Admission: RE | Disposition: A | Discharge status: Home / Self Care | Payer: Self-pay | Source: Home / Self Care | Attending: Gastroenterology

## 2023-03-19 ENCOUNTER — Ambulatory Visit
Admission: RE | Admit: 2023-03-19 | Discharge: 2023-03-19 | Disposition: A | Discharge status: Home / Self Care | Payer: Managed Care, Other (non HMO) | Attending: Gastroenterology | Admitting: Gastroenterology

## 2023-03-19 DIAGNOSIS — F32A Depression, unspecified: Secondary | ICD-10-CM | POA: Diagnosis not present

## 2023-03-19 DIAGNOSIS — K635 Polyp of colon: Secondary | ICD-10-CM

## 2023-03-19 DIAGNOSIS — Z1211 Encounter for screening for malignant neoplasm of colon: Secondary | ICD-10-CM | POA: Diagnosis present

## 2023-03-19 DIAGNOSIS — K219 Gastro-esophageal reflux disease without esophagitis: Secondary | ICD-10-CM

## 2023-03-19 DIAGNOSIS — F419 Anxiety disorder, unspecified: Secondary | ICD-10-CM | POA: Insufficient documentation

## 2023-03-19 HISTORY — PX: COLONOSCOPY WITH PROPOFOL: SHX5780

## 2023-03-19 HISTORY — DX: Gastro-esophageal reflux disease without esophagitis: K21.9

## 2023-03-19 HISTORY — DX: Anxiety disorder, unspecified: F41.9

## 2023-03-19 LAB — POCT PREGNANCY, URINE: Preg Test, Ur: NEGATIVE

## 2023-03-19 SURGERY — COLONOSCOPY WITH PROPOFOL
Anesthesia: General

## 2023-03-19 MED ORDER — PROPOFOL 10 MG/ML IV BOLUS
INTRAVENOUS | Status: AC
  Filled 2023-03-19: qty 40

## 2023-03-19 MED ORDER — PROPOFOL 10 MG/ML IV BOLUS
INTRAVENOUS | Status: DC | PRN
  Administered 2023-03-19 (×3): 50 mg via INTRAVENOUS
  Administered 2023-03-19: 100 mg via INTRAVENOUS
  Administered 2023-03-19 (×2): 50 mg via INTRAVENOUS
  Administered 2023-03-19: 100 mg via INTRAVENOUS
  Administered 2023-03-19 (×3): 50 mg via INTRAVENOUS

## 2023-03-19 MED ORDER — LIDOCAINE HCL (CARDIAC) PF 100 MG/5ML IV SOSY
PREFILLED_SYRINGE | INTRAVENOUS | Status: DC | PRN
  Administered 2023-03-19: 50 mg via INTRAVENOUS

## 2023-03-19 MED ORDER — PROPOFOL 10 MG/ML IV BOLUS
INTRAVENOUS | Status: AC
  Filled 2023-03-19: qty 20

## 2023-03-19 MED ORDER — SODIUM CHLORIDE 0.9 % IV SOLN: INTRAVENOUS | Status: DC

## 2023-03-19 MED ORDER — LACTATED RINGERS IV SOLN: INTRAVENOUS | Status: DC

## 2023-03-19 SURGICAL SUPPLY — 5 items
FORCEPS BIOP RAD 4 LRG CAP 4 (CUTTING FORCEPS) IMPLANT
GOWN CVR UNV OPN BCK APRN NK (MISCELLANEOUS) ×2 IMPLANT
KIT PRC NS LF DISP ENDO (KITS) ×1 IMPLANT
MANIFOLD NEPTUNE II (INSTRUMENTS) ×1 IMPLANT
WATER STERILE IRR 250ML POUR (IV SOLUTION) ×1 IMPLANT

## 2023-03-19 NOTE — Op Note (Signed)
[facility] Patient Name: Natalie Levine Procedure Date: 03/19/2023 8:51 AM MRN: [id number] Account #: [id number] Date of Birth: 1975/08/03 Admit Type: Outpatient Age: 47 Room: [facility] OR ROOM 01 Gender: Female Note Status: Finalized Instrument Name: [id number] Procedure:             Colonoscopy Indications:           Screening for colorectal malignant neoplasm Providers:             Midge Minium MD, MD Referring MD:          Carren Rang (Referring MD) Medicines:             Propofol per Anesthesia Complications:         No immediate complications. Procedure:             Pre-Anesthesia Assessment:                        - Prior to the procedure, a History and Physical was                         performed, and patient medications and allergies were                         reviewed. The patient's tolerance of previous                         anesthesia was also reviewed. The risks and benefits                         of the procedure and the sedation options and risks                         were discussed with the patient. All questions were                         answered, and informed consent was obtained. Prior                         Anticoagulants: The patient has taken no anticoagulant                         or antiplatelet agents. ASA Grade Assessment: II - A                         patient with mild systemic disease. After reviewing                         the risks and benefits, the patient was deemed in                         satisfactory condition to undergo the procedure.                        After obtaining informed consent, the colonoscope was                         passed under direct vision. Throughout the procedure,  the patient's blood pressure, pulse, and oxygen                         saturations were monitored continuously. The                         Colonoscope was introduced through the anus and                          advanced to the the cecum, identified by appendiceal                         orifice and ileocecal valve. The colonoscopy was                         performed without difficulty. The patient tolerated                         the procedure well. The quality of the bowel                         preparation was excellent. Findings:      The perianal and digital rectal examinations were normal.      A 3 mm polyp was found in the sigmoid colon. The polyp was sessile. The       polyp was removed with a cold biopsy forceps. Resection and retrieval       were complete.      The terminal ileum appeared normal. Impression:            - One 3 mm polyp in the sigmoid colon, removed with a                         cold biopsy forceps. Resected and retrieved.                        - The examined portion of the ileum was normal. Recommendation:        - Discharge patient to home.                        - Resume previous diet.                        - Continue present medications.                        - If the pathology report reveals adenomatous tissue,                         then repeat the colonoscopy for surveillance in 7                         years. Procedure Code(s):     --- Professional ---                        [id number], Colonoscopy, flexible; with biopsy, single or                         multiple Diagnosis Code(s):     ---  Professional ---                        Z12.11, Encounter for screening for malignant neoplasm                         of colon                        D12.5, Benign neoplasm of sigmoid colon CPT copyright 2022 American [employer]. All rights reserved. The codes documented in this report are preliminary and upon coder review may  be revised to meet current compliance requirements. Midge Minium MD, MD 03/19/2023 9:19:16 AM This report has been signed electronically. Number of Addenda: 0 Note Initiated On: 03/19/2023 8:51 AM Scope  Withdrawal Time: 0 hours 11 minutes 32 seconds  Total Procedure Duration: 0 hours 16 minutes 24 seconds  Estimated Blood Loss:  Estimated blood loss: none.      [facility]

## 2023-03-19 NOTE — Transfer of Care (Signed)
Immediate Anesthesia Transfer of Care Note  Patient: Natalie Levine  Procedure(s) Performed: COLONOSCOPY WITH PROPOFOL  Patient Location: PACU  Anesthesia Type: General  Level of Consciousness: awake, alert  and patient cooperative  Airway and Oxygen Therapy: Patient Spontanous Breathing and Patient connected to supplemental oxygen  Post-op Assessment: Post-op Vital signs reviewed, Patient's Cardiovascular Status Stable, Respiratory Function Stable, Patent Airway and No signs of Nausea or vomiting  Post-op Vital Signs: Reviewed and stable  Complications: No notable events documented.

## 2023-03-19 NOTE — H&P (Signed)
Midge Minium, MD [facility] [address], [address] [place], [place] [address] Phone: [phone] Fax : [phone]  Primary Care Physician:  Carren Rang, PA-C Primary Gastroenterologist:  Dr. Servando Snare  Pre-Procedure History & Physical: HPI:  Natalie Levine is a 47 y.o. female is here for a screening colonoscopy.   Past Medical History:  Diagnosis Date   Anxiety and depression    GERD (gastroesophageal reflux disease)     Past Surgical History:  Procedure Laterality Date   AUGMENTATION MAMMAPLASTY Bilateral 2016    Prior to Admission medications   Medication Sig Start Date End Date Taking? Authorizing Provider  buPROPion (WELLBUTRIN XL) 150 MG 24 hr tablet SMARTSIG:1.0 Tablet(s) By Mouth Daily   Yes [provider]  cetirizine (ZYRTEC) 5 MG tablet Take 5 mg by mouth daily.   Yes [provider]  levonorgestrel (MIRENA) 20 MCG/DAY IUD by Intrauterine route. 12/11/15  Yes [provider]  sertraline (ZOLOFT) 25 MG tablet 1 tablet. 01/18/23 01/18/24 Yes [provider]    Allergies as of 03/03/2023 - Review Complete 01/24/2021  Allergen Reaction Noted   Codeine Palpitations 04/11/2018    Family History  Problem Relation Age of Onset   Breast cancer Paternal Grandmother 32    Social History   Socioeconomic History   Marital status: Married    Spouse name: Not on file   Number of children: Not on file   Years of education: Not on file   Highest education level: Not on file  Occupational History   Not on file  Tobacco Use   Smoking status: Never   Smokeless tobacco: Never  Substance and Sexual Activity   Alcohol use: Not Currently   Drug use: Never   Sexual activity: Not on file  Other Topics Concern   Not on file  Social History Narrative   Not on file   Social Drivers of Health   Financial Resource Strain: Low Risk  (05/28/2022)   Received from [facility], [facility]   Overall  Financial Resource Strain (CARDIA)    Difficulty of Paying Living Expenses: Not hard at all  Food Insecurity: No Food Insecurity (05/28/2022)   Received from [facility], [facility]   Hunger Vital Sign    Worried About Running Out of Food in the Last Year: Never true    Ran Out of Food in the Last Year: Never true  Transportation Needs: No Transportation Needs (05/28/2022)   Received from [facility], [facility]   [facility] - Transportation    In the past 12 months, has lack of transportation kept you from medical appointments or from getting medications?: No    Lack of Transportation (Non-Medical): No  Physical Activity: Not on file  Stress: Not on file  Social Connections: Not on file  Intimate Partner Violence: Not on file    Review of Systems: See HPI, otherwise negative ROS  Physical Exam: BP 119/76   Pulse 75   Temp 98.4 F (36.9 C) (Temporal)   Resp 18   Ht 5\' 4"  (1.626 m)   Wt 70.8 kg   SpO2 100%   BMI 26.78 kg/m  General:   Alert,  pleasant and cooperative in NAD Head:  Normocephalic and atraumatic. Neck:  Supple; no masses or thyromegaly. Lungs:  Clear throughout to auscultation.    Heart:  Regular rate and rhythm. Abdomen:  Soft, nontender and nondistended. Normal bowel sounds, without guarding, and  without rebound.   Neurologic:  Alert and  oriented x4;  grossly normal neurologically.  Impression/Plan: Natalie Levine is now here to undergo a screening colonoscopy.  Risks, benefits, and alternatives regarding colonoscopy have been reviewed with the patient.  Questions have been answered.  All parties agreeable.

## 2023-03-19 NOTE — Anesthesia Postprocedure Evaluation (Signed)
Anesthesia Post Note  Patient: Natalie Levine  Procedure(s) Performed: COLONOSCOPY WITH PROPOFOL WITH POLYPECTOMY  Patient location during evaluation: PACU Anesthesia Type: General Level of consciousness: awake and alert Pain management: pain level controlled Vital Signs Assessment: post-procedure vital signs reviewed and stable Respiratory status: spontaneous breathing, nonlabored ventilation, respiratory function stable and patient connected to nasal cannula oxygen Cardiovascular status: blood pressure returned to baseline and stable Postop Assessment: no apparent nausea or vomiting Anesthetic complications: no   No notable events documented.   Last Vitals:  Vitals:   03/19/23 0930 03/19/23 0945  BP: 100/86 112/83  Pulse: 69 69  Resp: (!) 21 15  Temp:  36.8 C  SpO2: 100% 99%    Last Pain:  Vitals:   03/19/23 0945  TempSrc:   PainSc: 0-No pain                 Pratt Bress C Lukus Binion

## 2023-03-20 ENCOUNTER — Encounter: Payer: Self-pay | Admitting: Gastroenterology

## 2023-04-22 ENCOUNTER — Telehealth: Payer: Self-pay

## 2023-04-22 NOTE — Telephone Encounter (Signed)
This already known and EGD was removed from the procedure that was done Dec 2024

## 2023-04-22 NOTE — Telephone Encounter (Signed)
Received a fax back from [facility] that they have denied the EGD for patient. They said it was denied because it was not medical necessary per the guidelines of the plan. States your doctor told us that you have heartburn. A test to see images from your throat to your stomach was asked for. EGD can be done if she has done one of these:  Patient also needs to fail to improved after 8 weeks of a PPI taken once a day or a 4 week trial taken twice a day.  You have trouble swallowing You have chest pain when swallowing You have lost more then five percent of your weight in the past 6 to 12 months with out trying,  Your doctor suspects a GI Bleed Your have anemia  You have been vomiting for at least 7 days  You are throwing up blood Imaging of your upper GI shows an abnormal mass, narrowing or Ulcer   A peer to peer can be done at [phone] through [facility]

## 2023-09-08 ENCOUNTER — Other Ambulatory Visit: Payer: Self-pay | Admitting: Student

## 2023-09-08 DIAGNOSIS — Z1231 Encounter for screening mammogram for malignant neoplasm of breast: Secondary | ICD-10-CM

## 2023-09-14 ENCOUNTER — Ambulatory Visit

## 2023-10-21 ENCOUNTER — Ambulatory Visit

## 2023-10-21 ENCOUNTER — Ambulatory Visit
Admission: RE | Admit: 2023-10-21 | Discharge: 2023-10-21 | Disposition: A | Discharge status: Home / Self Care | Source: Ambulatory Visit | Attending: Student | Admitting: Student

## 2023-10-21 DIAGNOSIS — Z1231 Encounter for screening mammogram for malignant neoplasm of breast: Secondary | ICD-10-CM | POA: Diagnosis present

## 2023-10-28 ENCOUNTER — Other Ambulatory Visit: Payer: Self-pay | Admitting: Student

## 2023-10-28 DIAGNOSIS — R928 Other abnormal and inconclusive findings on diagnostic imaging of breast: Secondary | ICD-10-CM

## 2023-11-01 ENCOUNTER — Ambulatory Visit
Admission: RE | Admit: 2023-11-01 | Discharge: 2023-11-01 | Disposition: A | Discharge status: Home / Self Care | Source: Ambulatory Visit | Attending: Student | Admitting: Student

## 2023-11-01 ENCOUNTER — Ambulatory Visit
Admission: RE | Admit: 2023-11-01 | Discharge: 2023-11-01 | Disposition: A | Discharge status: Home / Self Care | Source: Ambulatory Visit | Attending: Student

## 2023-11-01 DIAGNOSIS — R928 Other abnormal and inconclusive findings on diagnostic imaging of breast: Secondary | ICD-10-CM | POA: Insufficient documentation
